# Patient Record
Sex: Female | Born: 1980 | Race: Black or African American | Hispanic: No | Smoking: Never smoker
Health system: Southern US, Community
[De-identification: ages and names within clinical notes are randomized; demographics above are authoritative.]

## PROBLEM LIST (undated history)

## (undated) DIAGNOSIS — E119 Type 2 diabetes mellitus without complications: Secondary | ICD-10-CM

## (undated) DIAGNOSIS — E7800 Pure hypercholesterolemia, unspecified: Secondary | ICD-10-CM

## (undated) HISTORY — DX: Pure hypercholesterolemia, unspecified: E78.00

## (undated) HISTORY — PX: OTHER SURGICAL HISTORY: SHX169

---

## 2019-03-11 ENCOUNTER — Encounter: Payer: Self-pay | Admitting: Family Medicine

## 2019-03-11 ENCOUNTER — Other Ambulatory Visit: Payer: Self-pay

## 2019-03-11 ENCOUNTER — Ambulatory Visit (LOCAL_COMMUNITY_HEALTH_CENTER): Payer: Medicaid Other | Admitting: Family Medicine

## 2019-03-11 VITALS — BP 115/72 | Ht 67.0 in | Wt 150.0 lb

## 2019-03-11 DIAGNOSIS — Z789 Other specified health status: Secondary | ICD-10-CM

## 2019-03-11 DIAGNOSIS — Z30013 Encounter for initial prescription of injectable contraceptive: Secondary | ICD-10-CM

## 2019-03-11 DIAGNOSIS — E109 Type 1 diabetes mellitus without complications: Secondary | ICD-10-CM | POA: Insufficient documentation

## 2019-03-11 DIAGNOSIS — Z113 Encounter for screening for infections with a predominantly sexual mode of transmission: Secondary | ICD-10-CM

## 2019-03-11 LAB — WET PREP FOR TRICH, YEAST, CLUE
Trichomonas Exam: NEGATIVE
Yeast Exam: NEGATIVE

## 2019-03-11 LAB — PREGNANCY, URINE: Preg Test, Ur: NEGATIVE

## 2019-03-11 MED ORDER — MEDROXYPROGESTERONE ACETATE 150 MG/ML IM SUSP
150.0000 mg | INTRAMUSCULAR | Status: AC
  Administered 2019-03-11: 12:00:00 150 mg via INTRAMUSCULAR

## 2019-03-11 MED ORDER — LEVONORGESTREL 1.5 MG PO TABS: 1.5000 mg | ORAL_TABLET | Freq: Once | ORAL | 0 refills | Status: AC

## 2019-03-11 MED ORDER — LEVONORGESTREL 1.5 MG PO TABS: 1.5000 mg | ORAL_TABLET | Freq: Once | ORAL | Status: DC

## 2019-03-11 NOTE — Progress Notes (Addendum)
UPT is negative today. Wet mount reviewed and is negative today, so no treatment needed for wet mount per standing order. Pt received Depo 150mg  IM per provider order and pt tolerated well. Pt aware that Plan B was sent to her pharmacy on file. [facility] [place] for last pap smear obtained and signed per provider verbal order. Counseled pt per provider orders and pt states understanding. ROI faxed per provider verbal order and fax confirmation received. Provider orders completed.Ronny Bacon, RN

## 2019-03-11 NOTE — Progress Notes (Signed)
Patient would like BC method today. Has used pills in the past but thinks she would like Depo. Aileen Fass, RN

## 2019-03-11 NOTE — Progress Notes (Signed)
Family Planning Visit  Subjective:  Rose Bruce is a 38 y.o. being seen today for  Chief Complaint  Patient presents with  . Contraception    Pt has Diabetes mellitus type 1 ([place]) on their problem list.  HPI  Patient reports she would like to restart depo. She has used depo in the past, last use 8 years ago, helped with heavy periods. She denies known breast cancer. She has diabetes type 1 but without nephrosis or vascular complications.  She would like STI testing. Denies symptoms. Has 1 partner, does not use condoms.     Patient's last menstrual period was 02/19/2019 (exact date). Last sex: 12/23 BCM: none Pt desires EC? yes  Last pap: May 2018, normal at [facility] in [place].   Patient reports 1 partner(s) in last year. Do they desire STI screening (if no, why not)? yes  Does the patient desire a pregnancy in the next year? no   38 y.o., Body mass index is 23.49 kg/m. - Is patient eligible for HA1C diabetes screening based on BMI and age >55?  no  Does the patient have a current or past history of drug use? no No components found for: HCV  See flowsheet for other program required questions.   Health Maintenance Due  Topic Date Due  . HEMOGLOBIN A1C  07/26/1982  . PNEUMOCOCCAL POLYSACCHARIDE VACCINE AGE 66-64 HIGH RISK  10/29/1984  . FOOT EXAM  10/29/1992  . OPHTHALMOLOGY EXAM  10/29/1992  . URINE MICROALBUMIN  10/29/1992  . HIV Screening  10/29/1997  . TETANUS/TDAP  10/29/2001  . INFLUENZA VACCINE  10/18/2018    ROS  The following portions of the patient's history were reviewed and updated as appropriate: allergies, current medications, past family history, past medical history, past social history, past surgical history and problem list. Problem list updated.  Objective:  BP 115/72   Ht 5\' 7"  (1.702 m)   Wt 150 lb (68 kg)   LMP 02/19/2019 (Exact Date)   BMI 23.49 kg/m    Physical Exam Vitals and nursing note reviewed.  Constitutional:       Appearance: Normal appearance.  HENT:     Head: Normocephalic and atraumatic.     Mouth/Throat:     Mouth: Mucous membranes are moist.     Pharynx: Oropharynx is clear. No oropharyngeal exudate or posterior oropharyngeal erythema.  Pulmonary:     Effort: Pulmonary effort is normal.  Abdominal:     General: Abdomen is flat.     Palpations: There is no mass.     Tenderness: There is no abdominal tenderness. There is no rebound.  Genitourinary:    General: Normal vulva.     Exam position: Lithotomy position.     Pubic Area: No rash or pubic lice.      Labia:        Right: No rash or lesion.        Left: No rash or lesion.      Vagina: Vaginal discharge (clear/white, ph>4.5) present. No erythema, bleeding or lesions.     Cervix: No cervical motion tenderness, discharge, friability, lesion or erythema.     Uterus: Normal.      Adnexa: Right adnexa normal and left adnexa normal.     Rectum: Normal.  Lymphadenopathy:     Head:     Right side of head: No preauricular or posterior auricular adenopathy.     Left side of head: No preauricular or posterior auricular adenopathy.     Cervical:  No cervical adenopathy.     Upper Body:     Right upper body: No supraclavicular or axillary adenopathy.     Left upper body: No supraclavicular or axillary adenopathy.     Lower Body: No right inguinal adenopathy. No left inguinal adenopathy.  Skin:    General: Skin is warm and dry.     Findings: No rash.  Neurological:     Mental Status: She is alert and oriented to person, place, and time.        Assessment and Plan:  Rose Bruce is a 38 y.o. female presenting to the [facility] for a well woman exam/family planning visit  Contraception counseling: Reviewed all forms of birth control options in the tiered based approach. available including abstinence; over the counter/barrier methods; hormonal contraceptive medication including pill, patch, ring,  injection,contraceptive implant; hormonal and nonhormonal IUDs; permanent sterilization options including vasectomy and the various tubal sterilization modalities. Risks, benefits, how to discontinue and typical effectiveness rates were reviewed.  Questions were answered.  Written information was also given to the patient to review.  Patient desires depo, this was prescribed for patient. She will follow up in  3 months for surveillance.  She was told to call with any further questions, or with any concerns about this method of contraception.  Emphasized use of condoms 100% of the time for STI prevention.    1. Encounter for initial prescription of injectable contraceptive -Rx depo x2. Pt to RTC in next 6 months for yearly wellness exam, refill depo. Counseling as above. -Per pt her last pap was 2 years ago at [facility], result normal. She likely doesn't need a pap for 3 more years but will have her sign for record release today to have verified at upcoming yearly visit in 3-6 months. - medroxyPROGESTERone (DEPO-PROVERA) injection 150 mg - Pregnancy, urine  2. Screening examination for venereal disease -Screenings today as below. Treat wet prep per standing order. -Patient does not meet criteria for HepB, HepC Screening.  -Counseled on warning s/sx and when to seek care. Recommended condom use with all sex and discussed importance of condom use for STI prevention.  - WET PREP FOR TRICH, YEAST, CLUE - [facility] - Syphilis [facility], [facility] - Chlamydia/Gonorrhea [facility]  3. Emergency contraception Emergency Contraception: Pt was offered ECP. ECP was accepted by pt, rx to pharmacy. ECP counseling was given. - levonorgestrel (PLAN B 1-STEP) tablet 1.5 mg     Return in about 3 months (around 06/09/2019) for yearly wellness exam.  No future appointments.  Ann Held, PA-C

## 2019-04-16 ENCOUNTER — Encounter: Payer: Self-pay | Admitting: Physician Assistant

## 2019-04-16 LAB — HM PAP SMEAR: HM Pap smear: NEGATIVE

## 2019-09-30 ENCOUNTER — Ambulatory Visit: Payer: Medicaid Other

## 2019-10-01 ENCOUNTER — Ambulatory Visit: Payer: Medicaid Other

## 2019-10-05 ENCOUNTER — Ambulatory Visit: Payer: Medicaid Other

## 2019-10-08 ENCOUNTER — Ambulatory Visit: Payer: Medicaid Other

## 2019-11-11 ENCOUNTER — Ambulatory Visit (INDEPENDENT_AMBULATORY_CARE_PROVIDER_SITE_OTHER): Payer: Medicaid Other

## 2019-11-11 ENCOUNTER — Other Ambulatory Visit: Payer: Self-pay

## 2019-11-11 ENCOUNTER — Ambulatory Visit
Admission: EM | Admit: 2019-11-11 | Discharge: 2019-11-11 | Disposition: A | Discharge status: Home / Self Care | Payer: Medicaid Other | Attending: Emergency Medicine | Admitting: Emergency Medicine

## 2019-11-11 ENCOUNTER — Encounter: Payer: Self-pay | Admitting: Emergency Medicine

## 2019-11-11 DIAGNOSIS — S93401A Sprain of unspecified ligament of right ankle, initial encounter: Secondary | ICD-10-CM

## 2019-11-11 DIAGNOSIS — M25571 Pain in right ankle and joints of right foot: Secondary | ICD-10-CM

## 2019-11-11 DIAGNOSIS — M7989 Other specified soft tissue disorders: Secondary | ICD-10-CM | POA: Diagnosis not present

## 2019-11-11 DIAGNOSIS — S99911A Unspecified injury of right ankle, initial encounter: Secondary | ICD-10-CM | POA: Diagnosis not present

## 2019-11-11 HISTORY — DX: Type 2 diabetes mellitus without complications: E11.9

## 2019-11-11 MED ORDER — IBUPROFEN 600 MG PO TABS: 600.0000 mg | ORAL_TABLET | Freq: Four times a day (QID) | ORAL | 0 refills | Status: DC | PRN

## 2019-11-11 NOTE — ED Triage Notes (Signed)
Patient in today c/o right ankle pain after twisting/turning her right ankle on a step. Patient states she did not fall. Patient has elevated her ankle.

## 2019-11-11 NOTE — Discharge Instructions (Addendum)
Your x-ray was negative for fracture.  Wear the ASO as needed for comfort.  It will help stabilize your ankle and reduce the swelling.  1000 mg of Tylenol with 600 mg of ibuprofen 3-4x/ day, elevation, ice.  Follow-up with [facility] in a week to 10 days.  You may benefit from physical therapy.

## 2019-11-11 NOTE — ED Provider Notes (Signed)
HPI  SUBJECTIVE:  Rose Bruce is a 39 y.o. female who presents with right ankle pain, lateral swelling after missing a step earlier this morning.  States that she heard a pop/crack, inverting her ankle.  She tried elevating it with some improvement in swelling and then came here.  Symptoms worse with weightbearing.  She was unable to bear weight immediately after the injury.  No bruising, numbness tingling distally, states that her foot is without injury.  She describes pain as constant, throbbing, located anteriorly.  She has a past medical history of diabetes type 1.  She has a history of ankle injury but does not remember which side.  LMP: November 20.  She is on continuous OCPs.  She denies possibly being pregnant.  She is status post bilateral tubal ligation.  PMD: [facility].  Past Medical History:  Diagnosis Date  . Diabetes mellitus without complication (HCC)    type 1    Past Surgical History:  Procedure Laterality Date  . CESAREAN SECTION    . tubal lig      Family History  Problem Relation Age of Onset  . Healthy Mother   . Healthy Father     Social History   Tobacco Use  . Smoking status: Never Smoker  . Smokeless tobacco: Never Used  Vaping Use  . Vaping Use: Never used  Substance Use Topics  . Alcohol use: Never  . Drug use: Never    No current facility-administered medications for this encounter.  Current Outpatient Medications:  .  insulin NPH-regular Human (70-30) 100 UNIT/ML injection, Inject into the skin., Disp: , Rfl:  .  ibuprofen (ADVIL) 600 MG tablet, Take 1 tablet (600 mg total) by mouth every 6 (six) hours as needed., Disp: 30 tablet, Rfl: 0  No Known Allergies   ROS  As noted in HPI.   Physical Exam  BP 120/72 (BP Location: Right Arm)   Pulse 80   Temp 98.3 F (36.8 C) (Oral)   Resp 18   Ht 5\' 5"  (1.651 m)   Wt 65.8 kg   LMP 03/13/2019 (Approximate) Comment: tubal ligation  SpO2 100%   BMI 24.13 kg/m    Constitutional: Well developed, well nourished, no acute distress Eyes:  EOMI, conjunctiva normal bilaterally HENT: Normocephalic, atraumatic,mucus membranes moist Respiratory: Normal inspiratory effort Cardiovascular: Normal rate GI: nondistended skin: No rash, skin intact Musculoskeletal: R Ankle  Proximal fibula NT , Distal fibula tender, Medial malleolus  tender,  Deltoid ligament medially tender,  Lateral ligaments  tender, ATFL laterally  tender, calcaneofibular ligament laterally tender, posterior tablofibular ligament laterally NT,  Achilles NT, calcaneus NT,  Proximal 5th metatarsal NT, Midfoot NT, distal NVI with baseline sensation / motor to foot with CR<2 seconds. Pain with dorsiflexion/plantar flexion. Pain with inversion/eversion. -Appreciable bruising. -  squeeze test  Ant drawer test stable Pt able to partially  bear weight in dept.  Neurologic: Alert & oriented x 3, no focal neuro deficits Psychiatric: Speech and behavior appropriate   ED Course   Medications - No data to display  Orders Placed This Encounter  Procedures  . DG Ankle Complete Right    Standing Status:   Standing    Number of Occurrences:   1    Order Specific Question:   Reason for Exam (SYMPTOM  OR DIAGNOSIS REQUIRED)    Answer:   pain, twisted/turned ankle DOI 11/11/19    Order Specific Question:   Is patient pregnant?    Answer:  No  . Apply ASO ankle    Standing Status:   Standing    Number of Occurrences:   1    Order Specific Question:   Laterality    Answer:   Right    No results found for this or any previous visit (from the past 24 hour(s)). DG Ankle Complete Right  Result Date: 11/11/2019 CLINICAL DATA:  Twisted ankle.  Now with pain EXAM: RIGHT ANKLE - COMPLETE 3+ VIEW COMPARISON:  None FINDINGS: Lateral soft tissue swelling is noted. No underlying fracture or dislocation identified. No radio-opaque foreign body or soft tissue calcifications. IMPRESSION: Lateral soft tissue swelling.  Electronically Signed   By: Signa Kell M.D.   On: 11/11/2019 08:54    ED Clinical Impression  1. Sprain of right ankle, unspecified ligament, initial encounter      ED Assessment/Plan  Reviewed imaging independently.  Lateral soft tissue swelling.  No fracture.  See radiology report for full details.  Patient with a right ankle sprain.  Home with ASO, Tylenol/ibuprofen, elevation, ice.  Follow-up with [facility] in a week to 10 days.  May benefit from physical therapy.  Discussed imaging, MDM, treatment plan, and plan for follow-up with patient.patient agrees with plan.   Meds ordered this encounter  Medications  . ibuprofen (ADVIL) 600 MG tablet    Sig: Take 1 tablet (600 mg total) by mouth every 6 (six) hours as needed.    Dispense:  30 tablet    Refill:  0    *This clinic note was created using Scientist, clinical (histocompatibility and immunogenetics). Therefore, there may be occasional mistakes despite careful proofreading.   ?    Domenick Gong, MD 11/11/19 [phone]

## 2020-01-07 ENCOUNTER — Ambulatory Visit: Payer: Medicaid Other

## 2020-01-08 ENCOUNTER — Ambulatory Visit
Admission: EM | Admit: 2020-01-08 | Discharge: 2020-01-08 | Disposition: A | Discharge status: Home / Self Care | Payer: Medicaid Other | Attending: Family Medicine | Admitting: Family Medicine

## 2020-01-08 ENCOUNTER — Other Ambulatory Visit: Payer: Self-pay

## 2020-01-08 DIAGNOSIS — N3001 Acute cystitis with hematuria: Secondary | ICD-10-CM | POA: Diagnosis not present

## 2020-01-08 LAB — URINALYSIS, COMPLETE (UACMP) WITH MICROSCOPIC
Bilirubin Urine: NEGATIVE
Glucose, UA: 500 mg/dL — AB
Nitrite: NEGATIVE
Protein, ur: NEGATIVE mg/dL
Specific Gravity, Urine: 1.02 (ref 1.005–1.030)
WBC, UA: >50 WBC/hpf (ref 0–5)
pH: 7 (ref 5.0–8.0)

## 2020-01-08 MED ORDER — CEPHALEXIN 500 MG PO CAPS: 500.0000 mg | ORAL_CAPSULE | Freq: Two times a day (BID) | ORAL | 0 refills | Status: DC

## 2020-01-08 NOTE — ED Triage Notes (Signed)
Patient states that she has been having urinary frequency urgency, dysuria that started yesterday.

## 2020-01-08 NOTE — Discharge Instructions (Signed)
You have a UTI.  Antibiotic twice daily x 7 days.  Take care  Dr. Adriana Simas

## 2020-01-08 NOTE — ED Provider Notes (Signed)
[facility]    CSN: [id number] Arrival date & time: 01/08/20  0855      History   Chief Complaint Chief Complaint  Patient presents with  . Urinary Frequency   HPI  39 year old female presents with urinary symptoms.  Started yesterday.  She reports dysuria, cloudy urine, and foul-smelling urine.  She reports some urinary frequency but attributes this to increased water intake.  No fever.  Patient reports some mild lower abdominal pain.  No flank pain.  No relieving factors.  No other associated symptoms.  No other complaints.  Past Medical History:  Diagnosis Date  . Diabetes mellitus without complication (HCC)    type 1    Patient Active Problem List   Diagnosis Date Noted  . Diabetes mellitus type 1 (HCC) 03/11/2019    Past Surgical History:  Procedure Laterality Date  . CESAREAN SECTION    . tubal lig      OB History   No obstetric history on file.      Home Medications    Prior to Admission medications   Medication Sig Start Date End Date Taking? Authorizing Provider  insulin NPH-regular Human (70-30) 100 UNIT/ML injection Inject into the skin.   Yes [provider]  cephALEXin (KEFLEX) 500 MG capsule Take 1 capsule (500 mg total) by mouth 2 (two) times daily. 01/08/20   Tommie Sams, DO    Family History Family History  Problem Relation Age of Onset  . Healthy Mother   . Healthy Father     Social History Social History   Tobacco Use  . Smoking status: Never Smoker  . Smokeless tobacco: Never Used  Vaping Use  . Vaping Use: Never used  Substance Use Topics  . Alcohol use: Never  . Drug use: Never     Allergies   Patient has no known allergies.   Review of Systems Review of Systems  Constitutional: Negative for fever.  Genitourinary: Positive for dysuria and frequency.   Physical Exam Triage Vital Signs ED Triage Vitals  Enc Vitals Group     BP 01/08/20 0908 131/83     Pulse Rate 01/08/20 0908 (!) 59      Resp 01/08/20 0908 18     Temp 01/08/20 0908 99.1 F (37.3 C)     Temp Source 01/08/20 0908 Oral     SpO2 01/08/20 0908 100 %     Weight 01/08/20 0905 140 lb (63.5 kg)     Height 01/08/20 0905 5\' 5"  (1.651 m)     Head Circumference --      Peak Flow --      Pain Score 01/08/20 0905 8     Pain Loc --      Pain Edu? --      Excl. in GC? --    Updated Vital Signs BP 131/83 (BP Location: Left Arm)   Pulse (!) 59   Temp 99.1 F (37.3 C) (Oral)   Resp 18   Ht 5\' 5"  (1.651 m)   Wt 63.5 kg   LMP 01/08/2020   SpO2 100%   BMI 23.30 kg/m   Visual Acuity Right Eye Distance:   Left Eye Distance:   Bilateral Distance:    Right Eye Near:   Left Eye Near:    Bilateral Near:     Physical Exam Vitals and nursing note reviewed.  Constitutional:      General: She is not in acute distress.    Appearance: Normal appearance. She is  not ill-appearing.  HENT:     Head: Normocephalic and atraumatic.  Eyes:     General:        Right eye: No discharge.        Left eye: No discharge.     Conjunctiva/sclera: Conjunctivae normal.  Cardiovascular:     Rate and Rhythm: Normal rate and regular rhythm.     Heart sounds: No murmur heard.   Pulmonary:     Effort: Pulmonary effort is normal.     Breath sounds: Normal breath sounds. No wheezing, rhonchi or rales.  Abdominal:     General: There is no distension.     Palpations: Abdomen is soft.     Tenderness: There is no abdominal tenderness.  Neurological:     Mental Status: She is alert.  Psychiatric:        Mood and Affect: Mood normal.        Behavior: Behavior normal.    UC Treatments / Results  Labs (all labs ordered are listed, but only abnormal results are displayed) Labs Reviewed  URINALYSIS, COMPLETE (UACMP) WITH MICROSCOPIC - Abnormal; Notable for the following components:      Result Value   APPearance CLOUDY (*)    Glucose, UA 500 (*)    Hgb urine dipstick MODERATE (*)    Ketones, ur TRACE (*)    Leukocytes,Ua TRACE  (*)    Bacteria, UA MANY (*)    All other components within normal limits  URINE CULTURE    EKG   Radiology No results found.  Procedures Procedures (including critical care time)  Medications Ordered in UC Medications - No data to display  Initial Impression / Assessment and Plan / UC Course  I have reviewed the triage vital signs and the nursing notes.  Pertinent labs & imaging results that were available during my care of the patient were reviewed by me and considered in my medical decision making (see chart for details).    39 year old female presents with UTI.  Treating with Keflex.  Awaiting culture.  Final Clinical Impressions(s) / UC Diagnoses   Final diagnoses:  Acute cystitis with hematuria     Discharge Instructions     You have a UTI.  Antibiotic twice daily x 7 days.  Take care  Dr. Adriana Simas     ED Prescriptions    Medication Sig Dispense Auth. Provider   cephALEXin (KEFLEX) 500 MG capsule Take 1 capsule (500 mg total) by mouth 2 (two) times daily. 14 capsule Everlene Other G, DO     PDMP not reviewed this encounter.   Everlene Other [place], [place] 01/08/20 [phone]

## 2020-01-10 LAB — URINE CULTURE: Culture: >=100000 — AB

## 2020-01-22 DIAGNOSIS — G8929 Other chronic pain: Secondary | ICD-10-CM | POA: Diagnosis not present

## 2020-01-22 DIAGNOSIS — M25571 Pain in right ankle and joints of right foot: Secondary | ICD-10-CM | POA: Diagnosis not present

## 2020-01-22 DIAGNOSIS — M79606 Pain in leg, unspecified: Secondary | ICD-10-CM | POA: Diagnosis not present

## 2020-01-22 DIAGNOSIS — E109 Type 1 diabetes mellitus without complications: Secondary | ICD-10-CM | POA: Diagnosis not present

## 2020-02-09 DIAGNOSIS — H5213 Myopia, bilateral: Secondary | ICD-10-CM | POA: Diagnosis not present

## 2020-02-16 DIAGNOSIS — H5213 Myopia, bilateral: Secondary | ICD-10-CM | POA: Diagnosis not present

## 2020-03-15 DIAGNOSIS — H5213 Myopia, bilateral: Secondary | ICD-10-CM | POA: Diagnosis not present

## 2020-03-22 ENCOUNTER — Other Ambulatory Visit: Payer: Self-pay

## 2020-03-22 ENCOUNTER — Ambulatory Visit
Admission: EM | Admit: 2020-03-22 | Discharge: 2020-03-22 | Disposition: A | Discharge status: Home / Self Care | Payer: Medicaid Other | Attending: Family Medicine | Admitting: Family Medicine

## 2020-03-22 ENCOUNTER — Ambulatory Visit
Admission: EM | Admit: 2020-03-22 | Discharge: 2020-03-22 | Disposition: A | Discharge status: Home / Self Care | Payer: Medicaid Other

## 2020-03-22 DIAGNOSIS — U071 COVID-19: Secondary | ICD-10-CM | POA: Diagnosis not present

## 2020-03-22 DIAGNOSIS — R059 Cough, unspecified: Secondary | ICD-10-CM

## 2020-03-22 DIAGNOSIS — B349 Viral infection, unspecified: Secondary | ICD-10-CM | POA: Diagnosis not present

## 2020-03-22 DIAGNOSIS — M791 Myalgia, unspecified site: Secondary | ICD-10-CM | POA: Diagnosis not present

## 2020-03-22 MED ORDER — BENZONATATE 100 MG PO CAPS: 200.0000 mg | ORAL_CAPSULE | Freq: Three times a day (TID) | ORAL | 0 refills | Status: AC | PRN

## 2020-03-22 MED ORDER — IBUPROFEN 800 MG PO TABS: 800.0000 mg | ORAL_TABLET | Freq: Three times a day (TID) | ORAL | 0 refills | Status: AC | PRN

## 2020-03-22 NOTE — Discharge Instructions (Addendum)

## 2020-03-22 NOTE — ED Triage Notes (Signed)
Pt reports having non productive cough, fatigue, nausea, fever, chills, body aches and dizziness. symptoms began on Wednesday.Marland Kitchen

## 2020-03-22 NOTE — ED Triage Notes (Signed)
Pt reports having non productive cough, fatigue nausea, fever, chills, body aches and dizziness. Symptoms began on Wednesday.

## 2020-03-22 NOTE — ED Provider Notes (Signed)
[facility]    CSN: [id number] Arrival date & time: 03/22/20  1704      History   Chief Complaint Chief Complaint  Patient presents with  . Cough    HPI Rose Bruce is a 40 y.o. female presenting for 6-day history of feeling feverish, fatigue, body aches, nausea, chills, cough, congestion and feeling dizzy.  Patient denies any known COVID-19 exposure.  Not vaccinated for COVID 19. She says that both of her children have been ill with similar symptoms but feel better.  Patient is type I diabetic.  She denies any recorded fever, weakness, chest pain, breathing difficulty, vomiting or diarrhea.  Has not taken any medication for symptoms.  No other complaints or concerns.  HPI  Past Medical History:  Diagnosis Date  . Diabetes mellitus without complication (HCC)    type 1    Patient Active Problem List   Diagnosis Date Noted  . Diabetes mellitus type 1 (HCC) 03/11/2019    Past Surgical History:  Procedure Laterality Date  . CESAREAN SECTION    . tubal lig      OB History   No obstetric history on file.      Home Medications    Prior to Admission medications   Medication Sig Start Date End Date Taking? Authorizing Provider  benzonatate (TESSALON PERLES) 100 MG capsule Take 2 capsules (200 mg total) by mouth 3 (three) times daily as needed for up to 7 days for cough. 03/22/20 03/29/20 Yes Shirlee Latch, PA-C  ibuprofen (ADVIL) 800 MG tablet Take 1 tablet (800 mg total) by mouth every 8 (eight) hours as needed for up to 5 days for moderate pain. 03/22/20 03/27/20 Yes Shirlee Latch, PA-C  cephALEXin (KEFLEX) 500 MG capsule Take 1 capsule (500 mg total) by mouth 2 (two) times daily. 01/08/20   Tommie Sams, DO  insulin NPH-regular Human (70-30) 100 UNIT/ML injection Inject into the skin.    [provider]    Family History Family History  Problem Relation Age of Onset  . Healthy Mother   . Healthy Father     Social History Social History    Tobacco Use  . Smoking status: Never Smoker  . Smokeless tobacco: Never Used  Vaping Use  . Vaping Use: Never used  Substance Use Topics  . Alcohol use: Never  . Drug use: Never     Allergies   Patient has no known allergies.   Review of Systems Review of Systems  Constitutional: Positive for fatigue and fever. Negative for chills and diaphoresis.  HENT: Positive for congestion and rhinorrhea. Negative for ear pain, sinus pressure, sinus pain and sore throat.   Respiratory: Positive for cough. Negative for shortness of breath and wheezing.   Cardiovascular: Negative for chest pain.  Gastrointestinal: Positive for nausea. Negative for abdominal pain and vomiting.  Musculoskeletal: Negative for arthralgias and myalgias.  Skin: Negative for rash.  Neurological: Positive for dizziness and headaches. Negative for weakness.  Hematological: Negative for adenopathy.     Physical Exam Triage Vital Signs ED Triage Vitals  Enc Vitals Group     BP 03/22/20 1755 131/70     Pulse Rate 03/22/20 1755 85     Resp 03/22/20 1755 18     Temp 03/22/20 1755 100.2 F (37.9 C)     Temp Source 03/22/20 1755 Oral     SpO2 03/22/20 1755 100 %     Weight --      Height --  Head Circumference --      Peak Flow --      Pain Score 03/22/20 1756 9     Pain Loc --      Pain Edu? --      Excl. in GC? --    No data found.  Updated Vital Signs BP 131/70   Pulse 85   Temp 100.2 F (37.9 C) (Oral)   Resp 18   SpO2 100%      Physical Exam Vitals and nursing note reviewed.  Constitutional:      General: She is not in acute distress.    Appearance: Normal appearance. She is ill-appearing. She is not toxic-appearing or diaphoretic.  HENT:     Head: Normocephalic and atraumatic.     Nose: Congestion and rhinorrhea present.     Mouth/Throat:     Mouth: Mucous membranes are moist.     Pharynx: Oropharynx is clear.  Eyes:     General: No scleral icterus.       Right eye: No  discharge.        Left eye: No discharge.     Conjunctiva/sclera: Conjunctivae normal.  Cardiovascular:     Rate and Rhythm: Normal rate and regular rhythm.     Heart sounds: Normal heart sounds.  Pulmonary:     Effort: Pulmonary effort is normal. No respiratory distress.     Breath sounds: Normal breath sounds. No wheezing, rhonchi or rales.  Musculoskeletal:     Cervical back: Neck supple.  Skin:    General: Skin is dry.  Neurological:     General: No focal deficit present.     Mental Status: She is alert. Mental status is at baseline.     Motor: No weakness.     Gait: Gait normal.  Psychiatric:        Mood and Affect: Mood normal.        Behavior: Behavior normal.        Thought Content: Thought content normal.      UC Treatments / Results  Labs (all labs ordered are listed, but only abnormal results are displayed) Labs Reviewed  SARS CORONAVIRUS 2 (TAT 6-24 HRS)    EKG   Radiology No results found.  Procedures Procedures (including critical care time)  Medications Ordered in UC Medications - No data to display  Initial Impression / Assessment and Plan / UC Course  I have reviewed the triage vital signs and the nursing notes.  Pertinent labs & imaging results that were available during my care of the patient were reviewed by me and considered in my medical decision making (see chart for details).  Clinical Course as of 03/24/20 1811  Thu Mar 24, 2020  1810 SARS Coronavirus 2(!): POSITIVE [LE]    Clinical Course User Index [LE] Shirlee Latch, PA-C   All vital signs are stable.  Her temperature is elevated to 100.2 degrees.  She is ill-appearing, but not toxic.  Patient is type I diabetic and not vaccinated for COVID-19.  She reports normal blood sugars recently.  COVID-19 testing obtained.  CDC guidelines, isolation protocol, and ED precautions reviewed with patient.  I do have high suspicion that she has COVID-19.  She has already been ill for 6 days.   Advised supportive care with increasing rest and fluids.  Advised her to isolate until she is starting to feel better which could be another 4 to 5 days or possibly longer.  Sent Advil and benzonatate to pharmacy.  Again, reviewed ED precautions with patient.  Final Clinical Impressions(s) / UC Diagnoses   Final diagnoses:  Viral illness  Cough  Myalgia     Discharge Instructions     You have received COVID testing today either for positive exposure, concerning symptoms that could be related to COVID infection, screening purposes, or re-testing after confirmed positive.  Your test obtained today checks for active viral infection in the last 1-2 weeks. If your test is negative now, you can still test positive later. So, if you do develop symptoms you should either get re-tested and/or isolate x 5 days and then strict mask use x 5 days (unvaccinated) or mask use x 10 days (vaccinated). Please follow CDC guidelines.  While Rapid antigen tests come back in 15-20 minutes, send out PCR/molecular test results typically come back within 1-3 days. In the mean time, if you are symptomatic, assume this could be a positive test and treat/monitor yourself as if you do have COVID.   We will call with test results if positive. Please download the MyChart app and set up a profile to access test results.   If symptomatic, go home and rest. Push fluids. Take Tylenol as needed for discomfort. Gargle warm salt water. Throat lozenges. Take Mucinex DM or Robitussin for cough. Humidifier in bedroom to ease coughing. Warm showers. Also review the COVID handout for more information.  COVID-19 INFECTION: The incubation period of COVID-19 is approximately 14 days after exposure, with most symptoms developing in roughly 4-5 days. Symptoms may range in severity from mild to critically severe. Roughly 80% of those infected will have mild symptoms. People of any age may become infected with COVID-19 and have the ability to  transmit the virus. The most common symptoms include: fever, fatigue, cough, body aches, headaches, sore throat, nasal congestion, shortness of breath, nausea, vomiting, diarrhea, changes in smell and/or taste.    COURSE OF ILLNESS Some patients may begin with mild disease which can progress quickly into critical symptoms. If your symptoms are worsening please call ahead to the Emergency Department and proceed there for further treatment. Recovery time appears to be roughly 1-2 weeks for mild symptoms and 3-6 weeks for severe disease.   GO IMMEDIATELY TO ER FOR FEVER YOU ARE UNABLE TO GET DOWN WITH TYLENOL, BREATHING PROBLEMS, CHEST PAIN, FATIGUE, LETHARGY, INABILITY TO EAT OR DRINK, ETC  QUARANTINE AND ISOLATION: To help decrease the spread of COVID-19 please remain isolated if you have COVID infection or are highly suspected to have COVID infection. This means -stay home and isolate to one room in the home if you live with others. Do not share a bed or bathroom with others while ill, sanitize and wipe down all countertops and keep common areas clean and disinfected. Stay home for 5 days. If you have no symptoms or your symptoms are resolving after 5 days, you can leave your house. Continue to wear a mask around others for 5 additional days. If you have been in close contact (within 6 feet) of someone diagnosed with COVID 19, you are advised to quarantine in your home for 14 days as symptoms can develop anywhere from 2-14 days after exposure to the virus. If you develop symptoms, you  must isolate.  Most current guidelines for COVID after exposure -unvaccinated: isolate 5 days and strict mask use x 5 days. Test on day 5 is possible -vaccinated: wear mask x 10 days if symptoms do not develop -You do not necessarily need to be tested for  COVID if you have + exposure and  develop symptoms. Just isolate at home x10 days from symptom onset During this global pandemic, CDC advises to practice social  distancing, try to stay at least 74ft away from others at all times. Wear a face covering. Wash and sanitize your hands regularly and avoid going anywhere that is not necessary.  KEEP IN MIND THAT THE COVID TEST IS NOT 100% ACCURATE AND YOU SHOULD STILL DO EVERYTHING TO PREVENT POTENTIAL SPREAD OF VIRUS TO OTHERS (WEAR MASK, WEAR GLOVES, [place] HANDS AND SANITIZE REGULARLY). IF INITIAL TEST IS NEGATIVE, THIS MAY NOT MEAN YOU ARE DEFINITELY NEGATIVE. MOST ACCURATE TESTING IS DONE 5-7 DAYS AFTER EXPOSURE.   It is not advised by CDC to get re-tested after receiving a positive COVID test since you can still test positive for weeks to months after you have already cleared the virus.   *If you have not been vaccinated for COVID, I strongly suggest you consider getting vaccinated as long as there are no contraindications.      ED Prescriptions    Medication Sig Dispense Auth. Provider   benzonatate (TESSALON PERLES) 100 MG capsule Take 2 capsules (200 mg total) by mouth 3 (three) times daily as needed for up to 7 days for cough. 20 capsule Laurene Footman B, PA-C   ibuprofen (ADVIL) 800 MG tablet Take 1 tablet (800 mg total) by mouth every 8 (eight) hours as needed for up to 5 days for moderate pain. 15 tablet Gretta Cool     PDMP not reviewed this encounter.   Danton Clap, PA-C 03/24/20 [phone]

## 2020-03-23 LAB — SARS CORONAVIRUS 2 (TAT 6-24 HRS): SARS Coronavirus 2: POSITIVE — AB

## 2020-03-26 ENCOUNTER — Encounter: Payer: Self-pay | Admitting: Physician Assistant

## 2020-03-26 ENCOUNTER — Telehealth: Payer: Medicaid Other | Admitting: Physician Assistant

## 2020-03-26 DIAGNOSIS — N390 Urinary tract infection, site not specified: Secondary | ICD-10-CM

## 2020-03-26 MED ORDER — NITROFURANTOIN MONOHYD MACRO 100 MG PO CAPS: 100.0000 mg | ORAL_CAPSULE | Freq: Two times a day (BID) | ORAL | 0 refills | Status: DC

## 2020-03-26 NOTE — Progress Notes (Signed)
We are sorry that you are not feeling well.  Here is how we plan to help!  Based on what you shared with me it looks like you most likely have a simple urinary tract infection.  A UTI (Urinary Tract Infection) is a bacterial infection of the bladder.  Most cases of urinary tract infections are simple to treat but a key part of your care is to encourage you to drink plenty of fluids and watch your symptoms carefully.  I have prescribed MacroBid 100 mg twice a day for 5 days.  Your symptoms should gradually improve. Call us if the burning in your urine worsens, you develop worsening fever, back pain or pelvic pain or if your symptoms do not resolve after completing the antibiotic.  Urinary tract infections can be prevented by drinking plenty of water to keep your body hydrated.  Also be sure when you wipe, wipe from front to back and don't hold it in!  If possible, empty your bladder every 4 hours.  Your e-visit answers were reviewed by a board certified advanced clinical practitioner to complete your personal care plan.  Depending on the condition, your plan could have included both over the counter or prescription medications.  If there is a problem please reply  once you have received a response from your provider.  Your safety is important to us.  If you have drug allergies check your prescription carefully.    You can use MyChart to ask questions about today's visit, request a non-urgent call back, or ask for a work or school excuse for 24 hours related to this e-Visit. If it has been greater than 24 hours you will need to follow up with your provider, or enter a new e-Visit to address those concerns.   You will get an e-mail in the next two days asking about your experience.  I hope that your e-visit has been valuable and will speed your recovery. Thank you for using e-visits.   I spent 5-10 minutes on review and completion of this note- Buell Parcel PAC  

## 2020-07-14 ENCOUNTER — Ambulatory Visit: Payer: Medicaid Other

## 2020-07-14 ENCOUNTER — Other Ambulatory Visit: Payer: Self-pay

## 2020-07-14 ENCOUNTER — Ambulatory Visit (LOCAL_COMMUNITY_HEALTH_CENTER): Payer: Medicaid Other | Admitting: Physician Assistant

## 2020-07-14 VITALS — BP 119/79 | Ht 65.6 in | Wt 146.4 lb

## 2020-07-14 DIAGNOSIS — Z3009 Encounter for other general counseling and advice on contraception: Secondary | ICD-10-CM

## 2020-07-14 DIAGNOSIS — Z9851 Tubal ligation status: Secondary | ICD-10-CM

## 2020-07-14 DIAGNOSIS — Z113 Encounter for screening for infections with a predominantly sexual mode of transmission: Secondary | ICD-10-CM

## 2020-07-15 DIAGNOSIS — Z9851 Tubal ligation status: Secondary | ICD-10-CM | POA: Insufficient documentation

## 2020-07-15 NOTE — Progress Notes (Signed)
Patient into clinic today scheduled for [facility] IP/RP.  Per patient chart and history form, she has had BTL (2018) and has not gotten pregnant since that procedure.  Patient counseled that due to funding, we are not able to provide her with a hormonal BCM to help with her periods and she will need to follow up with her PCP.  Apologized to patient for the inconvenience to her this morning.  Counseled patient on services that we can help her with in future.

## 2020-08-10 ENCOUNTER — Telehealth: Payer: Medicaid Other | Admitting: Family

## 2020-08-10 DIAGNOSIS — R399 Unspecified symptoms and signs involving the genitourinary system: Secondary | ICD-10-CM | POA: Diagnosis not present

## 2020-08-10 MED ORDER — CEPHALEXIN 500 MG PO CAPS: 500.0000 mg | ORAL_CAPSULE | Freq: Two times a day (BID) | ORAL | 0 refills | Status: DC

## 2020-08-10 NOTE — Progress Notes (Signed)

## 2021-02-23 ENCOUNTER — Ambulatory Visit
Admission: EM | Admit: 2021-02-23 | Discharge: 2021-02-23 | Disposition: A | Discharge status: Home / Self Care | Payer: Medicaid Other | Attending: Emergency Medicine | Admitting: Emergency Medicine

## 2021-02-23 ENCOUNTER — Encounter: Payer: Self-pay | Admitting: Emergency Medicine

## 2021-02-23 ENCOUNTER — Other Ambulatory Visit: Payer: Self-pay

## 2021-02-23 DIAGNOSIS — R109 Unspecified abdominal pain: Secondary | ICD-10-CM | POA: Insufficient documentation

## 2021-02-23 DIAGNOSIS — E860 Dehydration: Secondary | ICD-10-CM | POA: Diagnosis not present

## 2021-02-23 DIAGNOSIS — M940 Chondrocostal junction syndrome [Tietze]: Secondary | ICD-10-CM | POA: Insufficient documentation

## 2021-02-23 LAB — CBC WITH DIFFERENTIAL/PLATELET
Abs Immature Granulocytes: 0.02 10*3/uL (ref 0.00–0.07)
Basophils Absolute: 0.1 10*3/uL (ref 0.0–0.1)
Basophils Relative: 1 %
Eosinophils Absolute: 0.1 10*3/uL (ref 0.0–0.5)
Eosinophils Relative: 2 %
HCT: 39.3 % (ref 36.0–46.0)
Hemoglobin: 12.7 g/dL (ref 12.0–15.0)
Immature Granulocytes: 0 %
Lymphocytes Relative: 53 %
Lymphs Abs: 3.7 10*3/uL (ref 0.7–4.0)
MCH: 26.8 pg (ref 26.0–34.0)
MCHC: 32.3 g/dL (ref 30.0–36.0)
MCV: 83.1 fL (ref 80.0–100.0)
Monocytes Absolute: 0.3 10*3/uL (ref 0.1–1.0)
Monocytes Relative: 5 %
Neutro Abs: 2.7 10*3/uL (ref 1.7–7.7)
Neutrophils Relative %: 39 %
Platelets: 390 10*3/uL (ref 150–400)
RBC: 4.73 MIL/uL (ref 3.87–5.11)
RDW: 12.6 % (ref 11.5–15.5)
WBC: 6.9 10*3/uL (ref 4.0–10.5)
nRBC: 0 % (ref 0.0–0.2)

## 2021-02-23 LAB — URINALYSIS, COMPLETE (UACMP) WITH MICROSCOPIC
Bacteria, UA: NONE SEEN
Bilirubin Urine: NEGATIVE
Glucose, UA: 500 mg/dL — AB
Hgb urine dipstick: NEGATIVE
Leukocytes,Ua: NEGATIVE
Nitrite: NEGATIVE
Protein, ur: NEGATIVE mg/dL
Specific Gravity, Urine: >1.03 — ABNORMAL HIGH (ref 1.005–1.030)
pH: 5.5 (ref 5.0–8.0)

## 2021-02-23 LAB — CHLAMYDIA/NGC RT PCR (ARMC ONLY)
Chlamydia Tr: NOT DETECTED
N gonorrhoeae: NOT DETECTED

## 2021-02-23 LAB — WET PREP, GENITAL
Clue Cells Wet Prep HPF POC: NONE SEEN
Sperm: NONE SEEN
Trich, Wet Prep: NONE SEEN
WBC, Wet Prep HPF POC: <10 — AB (ref <10)
Yeast Wet Prep HPF POC: NONE SEEN

## 2021-02-23 NOTE — ED Provider Notes (Signed)
[facility]    CSN: [id number] Arrival date & time: 02/23/21  1112      History   Chief Complaint Chief Complaint  Patient presents with   Abdominal Pain   Fatigue    HPI Abigael Mogle is a 40 y.o. female.   HPI  40 year old female here for evaluation of lower abdominal pain and nausea.  Patient ports that she has been having intermittent lower abdominal pain in the suprapubic region for the last 4 days that is also associated with intermittent nausea and a "slimy" vaginal discharge.  She denies any odor or color to the discharge.  She is also experiencing some low back pain.  She denies fever, vomiting or diarrhea, painful urination or urinary urgency or frequency.  She also denies constipation.  As an aside patient reports that she is also experiencing pain and the middle of her chest that is associated with physical exertion but is not present at rest.  She denies any shortness of breath, burning in her esophagus, sore throat, or sour taste in her mouth.  The pain is not affected by food.  The only thing it makes the pain worse is physical exertion.  Past Medical History:  Diagnosis Date   Diabetes mellitus without complication (HCC)    type 1   Hypercholesteremia     Patient Active Problem List   Diagnosis Date Noted   History of bilateral tubal ligation 07/15/2020   Diabetes mellitus type 1 (HCC) 03/11/2019    Past Surgical History:  Procedure Laterality Date   CESAREAN SECTION     tubal lig      OB History     Gravida  6   Para  2   Term      Preterm  2   AB      Living  2      SAB      IAB      Ectopic      Multiple      Live Births               Home Medications    Prior to Admission medications   Medication Sig Start Date End Date Taking? Authorizing Provider  insulin NPH-regular Human (70-30) 100 UNIT/ML injection Inject into the skin.   Yes [provider]    Family History Family History  Problem  Relation Age of Onset   Healthy Mother    Healthy Father    Diabetes Maternal Grandfather     Social History Social History   Tobacco Use   Smoking status: Never   Smokeless tobacco: Never  Vaping Use   Vaping Use: Never used  Substance Use Topics   Alcohol use: Never   Drug use: Never     Allergies   Patient has no known allergies.   Review of Systems Review of Systems  Constitutional:  Negative for activity change, appetite change and fever.  HENT:  Negative for sore throat.   Respiratory:  Negative for shortness of breath.   Cardiovascular:  Positive for chest pain. Negative for palpitations.  Gastrointestinal:  Positive for abdominal pain. Negative for constipation, diarrhea, nausea and vomiting.  Musculoskeletal:  Positive for back pain.  Skin:  Negative for rash.  Hematological: Negative.   Psychiatric/Behavioral: Negative.      Physical Exam Triage Vital Signs ED Triage Vitals  Enc Vitals Group     BP 02/23/21 1152 120/74     Pulse Rate 02/23/21 1152 73  Resp 02/23/21 1152 16     Temp 02/23/21 1152 98.4 F (36.9 C)     Temp Source 02/23/21 1152 Oral     SpO2 02/23/21 1152 100 %     Weight --      Height --      Head Circumference --      Peak Flow --      Pain Score 02/23/21 1156 0     Pain Loc --      Pain Edu? --      Excl. in GC? --    No data found.  Updated Vital Signs BP 120/74 (BP Location: Right Arm)   Pulse 73   Temp 98.4 F (36.9 C) (Oral)   Resp 16   LMP 01/29/2021 (Exact Date) Comment: +tubal ligation  SpO2 100%   Visual Acuity Right Eye Distance:   Left Eye Distance:   Bilateral Distance:    Right Eye Near:   Left Eye Near:    Bilateral Near:     Physical Exam Vitals and nursing note reviewed.  Constitutional:      General: She is not in acute distress.    Appearance: Normal appearance. She is normal weight. She is not ill-appearing.  HENT:     Head: Normocephalic and atraumatic.  Cardiovascular:     Rate and  Rhythm: Normal rate and regular rhythm.     Pulses: Normal pulses.     Heart sounds: Normal heart sounds. No murmur heard.   No gallop.  Pulmonary:     Effort: Pulmonary effort is normal.     Breath sounds: Normal breath sounds. No wheezing, rhonchi or rales.     Comments: Patient is tenderness when palpating the costosternal joints along either side of her sternum. Chest:     Chest wall: Tenderness present.  Abdominal:     General: Abdomen is flat.     Palpations: Abdomen is soft.     Tenderness: There is abdominal tenderness. There is no right CVA tenderness, left CVA tenderness, guarding or rebound.     Comments: Patient is suprapubic tenderness.  Skin:    General: Skin is warm and dry.     Capillary Refill: Capillary refill takes less than 2 seconds.     Findings: No erythema or rash.  Neurological:     General: No focal deficit present.     Mental Status: She is alert and oriented to person, place, and time.  Psychiatric:        Mood and Affect: Mood normal.        Behavior: Behavior normal.        Thought Content: Thought content normal.        Judgment: Judgment normal.     UC Treatments / Results  Labs (all labs ordered are listed, but only abnormal results are displayed) Labs Reviewed  WET PREP, GENITAL - Abnormal; Notable for the following components:      Result Value   WBC, Wet Prep HPF POC <10 (*)    All other components within normal limits  URINALYSIS, COMPLETE (UACMP) WITH MICROSCOPIC - Abnormal; Notable for the following components:   Specific Gravity, Urine >1.030 (*)    Glucose, UA 500 (*)    Ketones, ur TRACE (*)    All other components within normal limits  CHLAMYDIA/NGC RT PCR ([facility] ONLY)            CBC WITH DIFFERENTIAL/PLATELET    EKG   Radiology No results found.  Procedures  Procedures (including critical care time)  Medications Ordered in UC Medications - No data to display  Initial Impression / Assessment and Plan / UC Course  I  have reviewed the triage vital signs and the nursing notes.  Pertinent labs & imaging results that were available during my care of the patient were reviewed by me and considered in my medical decision making (see chart for details).  Patient is a nontoxic-appearing 40 year old female here for evaluation of suprapubic abdominal pain, low back pain, and nausea that has been intermittent for last 4 days and is associated with a vaginal discharge.  Patient denies any urinary symptoms, nausea or vomiting, or fever.  She is also complaining of pain in middle of her chest which is only present when she is undergoing physical activity but is not present when she is at rest.  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  On physical exam patient is a benign cardiopulmonary exam with clear lung sounds in all fields.  Patient has tenderness with palpation of the costosternal joints on either side of her sternum.  No tenderness with palpation of the remainder of her chest wall.  Heart sounds are S1-S2 without murmur, rub, or ectopy.  No CVA tenderness on exam.  Abdomen is flat, soft, with mild suprapubic tenderness.  No guarding or rebound noted on exam.  Given patient's low back pain, suprapubic pain, and vaginal discharge will check wet prep and UA.  When I was leaving the room patient also reports that she is been feeling weak of late and thinks she might be anemic.  I will check a CBC to look for any signs of anemia.  CBC is completely unremarkable.  Wet prep is unremarkable.  Urinalysis shows high specific gravity and 500 glucose in her urine with trace ketones.  No evidence of infection.  We will send gonorrhea and chlamydia swab.  Patient states that she does not believe she has chlamydia because she has had in the past and has caused burning in her vaginal area and she not having that at present.  With the lower abdominal pain, back pain, and clear discharge  it is a possibility.  She has consented to being tested.  I will have her follow-up with her PCP/OB/GYN if the gonorrhea and Chlamydia test is negative.  For her chest pain I believe it is costochondritis and will have her use over-the-counter NSAIDs according to the package instruction as needed for pain.  I believe her intermittent dizziness may be secondary to mild dehydration as her urinalysis showed a high specific gravity and the presence of ketones.     Final Clinical Impressions(s) / UC Diagnoses   Final diagnoses:  Abdominal pain, unspecified abdominal location  Costochondritis  Dehydration     Discharge Instructions      Your testing today did not yield any significant results that would demonstrate the source of your lower abdominal pain and back pain.  As we discussed, we will send off a test for gonorrhea chlamydia to see if either of those infections are present entry to if any of the tests are positive.  If your test are negative I do recommend that you follow-up with your PCP/OB/GYN for further evaluation.  For your chest pain, I believe it is coming from inflammation of the costosternal joints, where your ribs and sternum meet, and I recommend you take over-the-counter ibuprofen according to the package instructions as needed for  pain.  You can also apply moist heat to your chest wall for 20 minutes at a time 2-3 times a day to improve blood flow and help with pain relief.  For your feelings of fatigue your blood work did not show any signs of anemia but I do believe you have some mild dehydration based on the high specific gravity of your urine and the presence of ketones.  I recommend increasing your water intake to a goal of eight 8 ounce glasses a day at a minimum.     ED Prescriptions   None    PDMP not reviewed this encounter.   Becky Augusta, NP 02/23/21 1357

## 2021-02-23 NOTE — Discharge Instructions (Addendum)
Your testing today did not yield any significant results that would demonstrate the source of your lower abdominal pain and back pain.  As we discussed, we will send off a test for gonorrhea chlamydia to see if either of those infections are present entry to if any of the tests are positive.  If your test are negative I do recommend that you follow-up with your PCP/OB/GYN for further evaluation.  For your chest pain, I believe it is coming from inflammation of the costosternal joints, where your ribs and sternum meet, and I recommend you take over-the-counter ibuprofen according to the package instructions as needed for pain.  You can also apply moist heat to your chest wall for 20 minutes at a time 2-3 times a day to improve blood flow and help with pain relief.  For your feelings of fatigue your blood work did not show any signs of anemia but I do believe you have some mild dehydration based on the high specific gravity of your urine and the presence of ketones.  I recommend increasing your water intake to a goal of eight 8 ounce glasses a day at a minimum.

## 2021-02-23 NOTE — ED Triage Notes (Signed)
Pt presents today with c/o intermittent abdominal pain with fatigue x 4 days. +nausea.

## 2021-06-13 ENCOUNTER — Ambulatory Visit: Payer: Medicaid Other | Admitting: Family Medicine

## 2021-07-07 ENCOUNTER — Telehealth: Payer: Medicaid Other | Admitting: Physician Assistant

## 2021-07-07 DIAGNOSIS — R3989 Other symptoms and signs involving the genitourinary system: Secondary | ICD-10-CM

## 2021-07-07 MED ORDER — CEPHALEXIN 500 MG PO CAPS: 500.0000 mg | ORAL_CAPSULE | Freq: Two times a day (BID) | ORAL | 0 refills | Status: AC

## 2021-07-07 NOTE — Progress Notes (Signed)
I have spent 5 minutes in review of e-visit questionnaire, review and updating patient chart, medical decision making and response to patient.   Thaddius Manes Cody Katalin Colledge, PA-C    

## 2021-07-07 NOTE — Progress Notes (Signed)

## 2021-07-20 ENCOUNTER — Telehealth: Payer: Medicaid Other | Admitting: Physician Assistant

## 2021-07-20 DIAGNOSIS — N390 Urinary tract infection, site not specified: Secondary | ICD-10-CM

## 2021-07-20 NOTE — Progress Notes (Signed)
Based on what you shared with me, I feel your condition warrants further evaluation and I recommend that you be seen in a face to face visit. Giving recent treatment for similar symptoms and recurrence of symptoms you will have to be evaluated in person either with your PCP office or at a local urgent care as you need examination and a urine culture to see exactly what antibiotics this infection is sensitive or resistant to, so you can get it properly treated.  ?  ?NOTE: There will be NO CHARGE for this eVisit ?  ?If you are having a true medical emergency please call 911.   ?  ? For an urgent face to face visit, [place] has six urgent care centers for your convenience:  ?  ? [facility] [place] [facility] ?Get Driving Directions ?[phone] ?[address] ?[place], [place] [address] ?  ? [facility]) ?Get Driving Directions ?[phone] ?[address] ?[place], [place] [address] ? ?[facility] [place] [facility]) ?Get Driving Directions ?[address] [place],  [place]  [address] ? ?[facility] ?Get Driving Directions ?[phone] ?[address], [address] ?[place], [place] [address] ?  ?[facility] [place] ?Get Driving Directions  ?[phone] ?[address] ?[address] ?[place], [place] [address] ?  ?[facility] ?Get Driving Directions ?[phone] ?[address] ?[place],  [address] ? ?Your MyChart E-visit questionnaire answers were reviewed by a board certified advanced clinical practitioner to complete your personal care plan based on your specific symptoms.  Thank you for using e-Visits. ?  ? ?

## 2021-08-29 ENCOUNTER — Telehealth: Payer: Medicaid Other | Admitting: Physician Assistant

## 2021-08-29 DIAGNOSIS — R3989 Other symptoms and signs involving the genitourinary system: Secondary | ICD-10-CM

## 2021-08-29 DIAGNOSIS — N390 Urinary tract infection, site not specified: Secondary | ICD-10-CM | POA: Diagnosis not present

## 2021-08-29 MED ORDER — SULFAMETHOXAZOLE-TRIMETHOPRIM 800-160 MG PO TABS: 1.0000 | ORAL_TABLET | Freq: Two times a day (BID) | ORAL | 0 refills | Status: AC

## 2021-08-29 NOTE — Progress Notes (Signed)

## 2021-08-29 NOTE — Progress Notes (Signed)
I have spent 5 minutes in review of e-visit questionnaire, review and updating patient chart, medical decision making and response to patient.   Saurabh Hettich Cody Miaya Lafontant, PA-C    

## 2021-10-20 IMAGING — CR DG ANKLE COMPLETE 3+V*R*
3 series · 3 of 3 positions shown · non-contrast
Comparison: None

CLINICAL DATA: Twisted ankle.  Now with pain

EXAM:
RIGHT ANKLE - COMPLETE 3+ VIEW

[ankle ap]
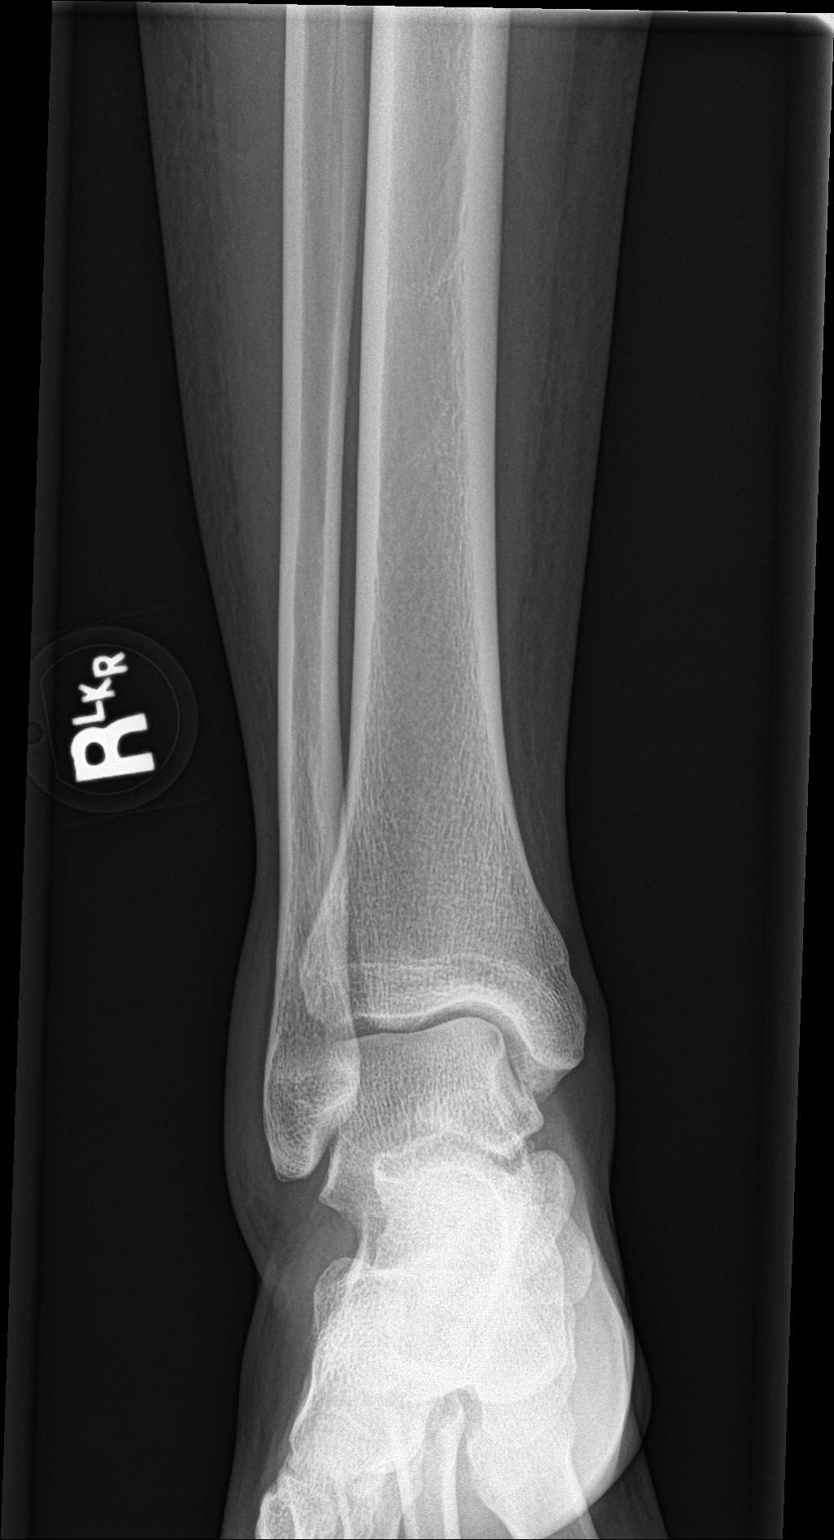

[ankle obl]
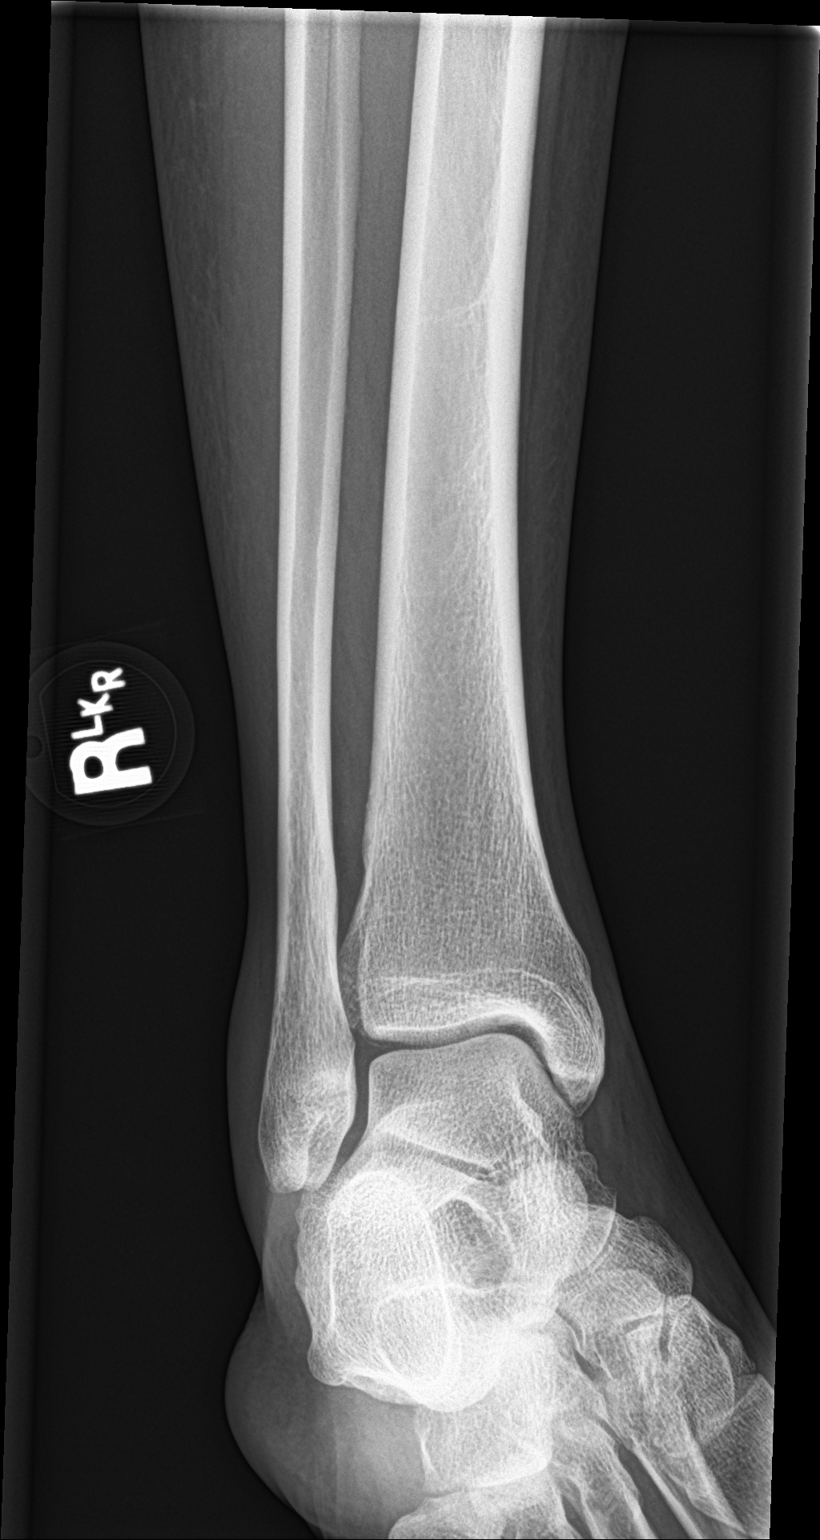

[ankle lat]
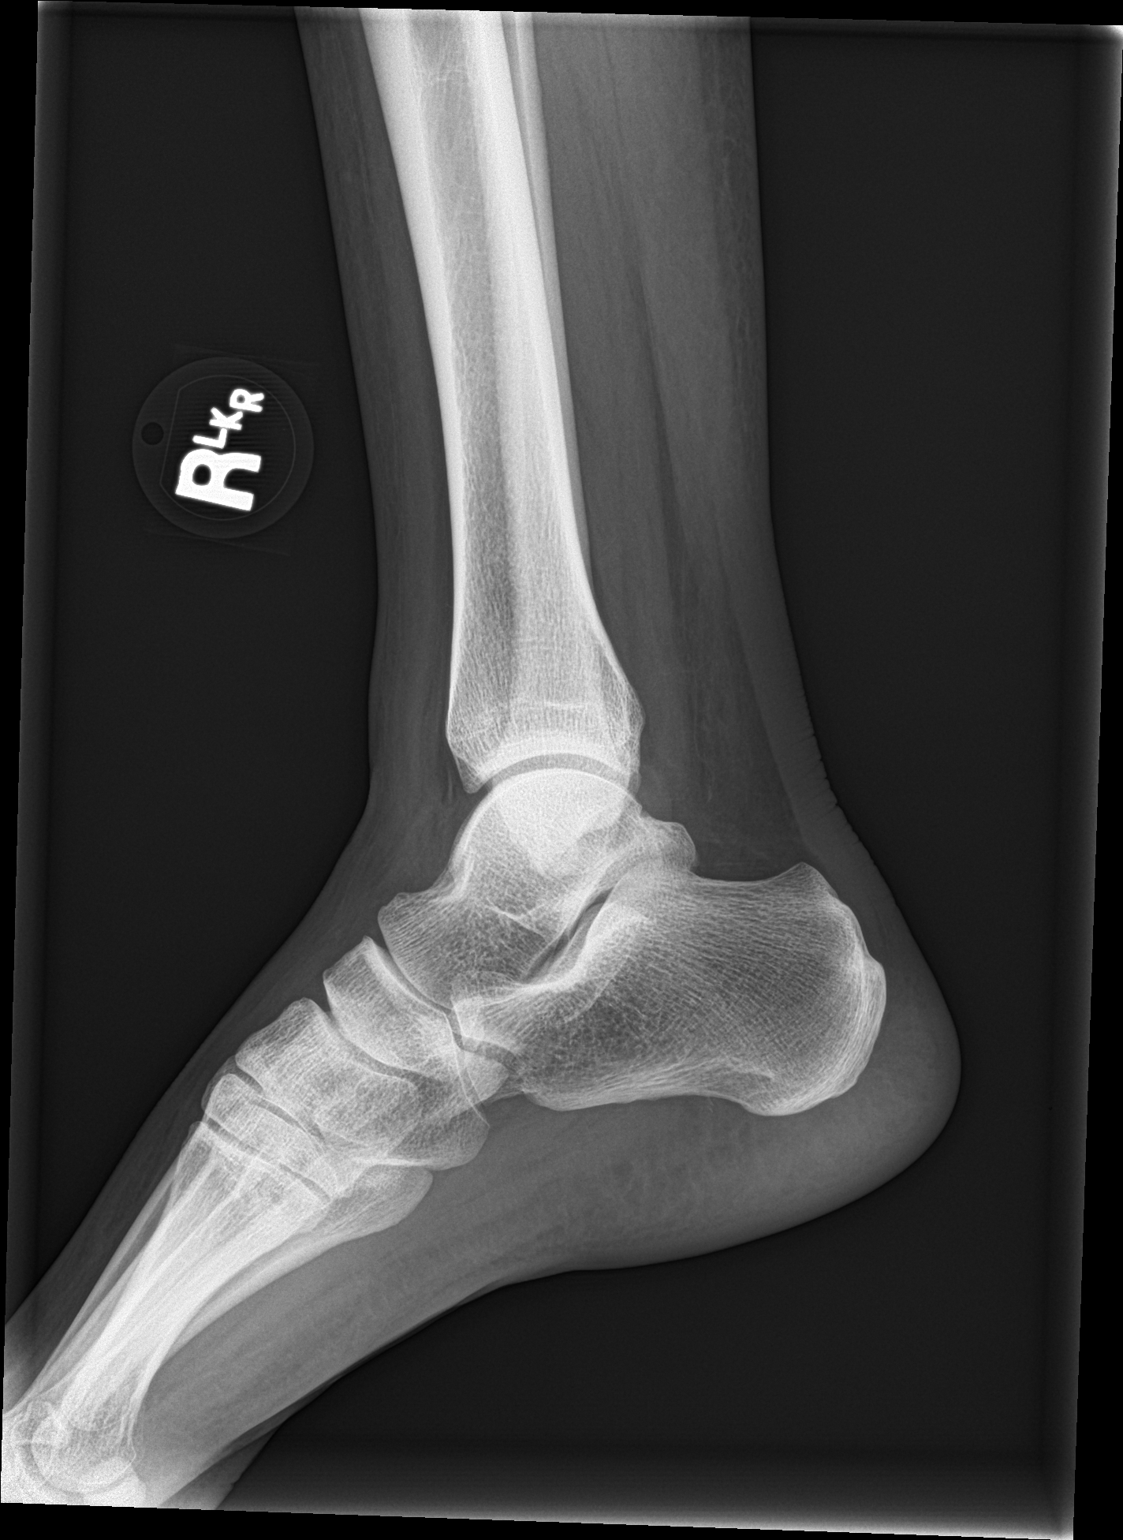

[3 of 3 positions shown; findings below may reference images not displayed]

FINDINGS: Lateral soft tissue swelling is noted. No underlying fracture or
dislocation identified. No radio-opaque foreign body or soft tissue
calcifications.
IMPRESSION: Lateral soft tissue swelling.

## 2022-02-26 ENCOUNTER — Telehealth: Payer: Medicaid Other | Admitting: Physician Assistant

## 2022-02-26 DIAGNOSIS — R3989 Other symptoms and signs involving the genitourinary system: Secondary | ICD-10-CM

## 2022-02-26 MED ORDER — CEPHALEXIN 500 MG PO CAPS: 500.0000 mg | ORAL_CAPSULE | Freq: Two times a day (BID) | ORAL | 0 refills | Status: DC

## 2022-02-26 NOTE — Progress Notes (Signed)

## 2022-04-19 ENCOUNTER — Telehealth: Payer: Self-pay | Admitting: Physician Assistant

## 2022-04-19 DIAGNOSIS — R3989 Other symptoms and signs involving the genitourinary system: Secondary | ICD-10-CM

## 2022-04-19 MED ORDER — SULFAMETHOXAZOLE-TRIMETHOPRIM 800-160 MG PO TABS: 1.0000 | ORAL_TABLET | Freq: Two times a day (BID) | ORAL | 0 refills | Status: AC

## 2022-04-19 NOTE — Progress Notes (Signed)
E-Visit for Urinary Problems  We are sorry that you are not feeling well.  Here is how we plan to help!  Based on what you shared with me it looks like you most likely have a simple urinary tract infection.  A UTI (Urinary Tract Infection) is a bacterial infection of the bladder.  Most cases of urinary tract infections are simple to treat but a key part of your care is to encourage you to drink plenty of fluids and watch your symptoms carefully.  I have prescribed Bactrim DS One tablet twice a day for 5 days.  Your symptoms should gradually improve. Call us if the burning in your urine worsens, you develop worsening fever, back pain or pelvic pain or if your symptoms do not resolve after completing the antibiotic.  Urinary tract infections can be prevented by drinking plenty of water to keep your body hydrated.  Also be sure when you wipe, wipe from front to back and don't hold it in!  If possible, empty your bladder every 4 hours.  HOME CARE Drink plenty of fluids Compete the full course of the antibiotics even if the symptoms resolve Remember, when you need to go.go. Holding in your urine can increase the likelihood of getting a UTI! GET HELP RIGHT AWAY IF: You cannot urinate You get a high fever Worsening back pain occurs You see blood in your urine You feel sick to your stomach or throw up You feel like you are going to pass out  MAKE SURE YOU  Understand these instructions. Will watch your condition. Will get help right away if you are not doing well or get worse.   Thank you for choosing an e-visit.  Your e-visit answers were reviewed by a board certified advanced clinical practitioner to complete your personal care plan. Depending upon the condition, your plan could have included both over the counter or prescription medications.  Please review your pharmacy choice. Make sure the pharmacy is open so you can pick up prescription now. If there is a problem, you may contact  your provider through [employer] and have the prescription routed to another pharmacy.  Your safety is important to [place]. If you have drug allergies check your prescription carefully.   For the next 24 hours you can use MyChart to ask questions about today's visit, request a non-urgent call back, or ask for a work or school excuse. You will get an email in the next two days asking about your experience. I hope that your e-visit has been valuable and will speed your recovery.

## 2022-04-19 NOTE — Progress Notes (Signed)
I have spent 5 minutes in review of e-visit questionnaire, review and updating patient chart, medical decision making and response to patient.   Abiageal Blowe Cody Deandre Brannan, PA-C    

## 2024-04-13 ENCOUNTER — Ambulatory Visit: Payer: Self-pay

## 2024-04-21 ENCOUNTER — Ambulatory Visit: Payer: Self-pay
